# Patient Record
Sex: Male | Born: 1985 | Race: Black or African American | Hispanic: No | Marital: Single | Smoking: Former smoker
Health system: Southern US, Community
[De-identification: ages and names within clinical notes are randomized; demographics above are authoritative.]

---
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
## 2010-03-13 ENCOUNTER — Emergency Department (HOSPITAL_COMMUNITY): Admission: EM | Admit: 2010-03-13 | Discharge: 2010-03-13 | Payer: Self-pay | Admitting: Emergency Medicine

## 2014-08-18 ENCOUNTER — Emergency Department (HOSPITAL_COMMUNITY)
Admission: EM | Admit: 2014-08-18 | Discharge: 2014-08-18 | Disposition: A | Discharge status: Home / Self Care | Payer: Self-pay | Attending: Emergency Medicine | Admitting: Emergency Medicine

## 2014-08-18 ENCOUNTER — Encounter (HOSPITAL_COMMUNITY): Payer: Self-pay | Admitting: Emergency Medicine

## 2014-08-18 ENCOUNTER — Emergency Department (HOSPITAL_COMMUNITY): Payer: Self-pay

## 2014-08-18 DIAGNOSIS — R112 Nausea with vomiting, unspecified: Secondary | ICD-10-CM

## 2014-08-18 DIAGNOSIS — Z87891 Personal history of nicotine dependence: Secondary | ICD-10-CM | POA: Insufficient documentation

## 2014-08-18 DIAGNOSIS — K529 Noninfective gastroenteritis and colitis, unspecified: Secondary | ICD-10-CM

## 2014-08-18 DIAGNOSIS — R109 Unspecified abdominal pain: Secondary | ICD-10-CM

## 2014-08-18 DIAGNOSIS — R197 Diarrhea, unspecified: Secondary | ICD-10-CM

## 2014-08-18 LAB — CBC WITH DIFFERENTIAL/PLATELET
BASOS PCT: 0 % (ref 0–1)
Basophils Absolute: 0 10*3/uL (ref 0.0–0.1)
EOS PCT: 0 % (ref 0–5)
Eosinophils Absolute: 0.1 10*3/uL (ref 0.0–0.7)
HCT: 46.4 % (ref 39.0–52.0)
Hemoglobin: 15.3 g/dL (ref 13.0–17.0)
LYMPHS ABS: 0.7 10*3/uL (ref 0.7–4.0)
LYMPHS PCT: 5 % — AB (ref 12–46)
MCH: 26.9 pg (ref 26.0–34.0)
MCHC: 33 g/dL (ref 30.0–36.0)
MCV: 81.7 fL (ref 78.0–100.0)
Monocytes Absolute: 0.7 10*3/uL (ref 0.1–1.0)
Monocytes Relative: 5 % (ref 3–12)
Neutro Abs: 12.9 10*3/uL — ABNORMAL HIGH (ref 1.7–7.7)
Neutrophils Relative %: 90 % — ABNORMAL HIGH (ref 43–77)
Platelets: 293 10*3/uL (ref 150–400)
RBC: 5.68 MIL/uL (ref 4.22–5.81)
RDW: 13.8 % (ref 11.5–15.5)
WBC: 14.3 10*3/uL — AB (ref 4.0–10.5)

## 2014-08-18 LAB — BASIC METABOLIC PANEL
Anion gap: 8 (ref 5–15)
BUN: 12 mg/dL (ref 6–23)
CALCIUM: 9.3 mg/dL (ref 8.4–10.5)
CHLORIDE: 108 meq/L (ref 96–112)
CO2: 22 mmol/L (ref 19–32)
Creatinine, Ser: 0.74 mg/dL (ref 0.50–1.35)
GFR calc Af Amer: >90 mL/min (ref >90)
GFR calc non Af Amer: >90 mL/min (ref >90)
Glucose, Bld: 103 mg/dL — ABNORMAL HIGH (ref 70–99)
Potassium: 3.8 mmol/L (ref 3.5–5.1)
SODIUM: 138 mmol/L (ref 135–145)

## 2014-08-18 MED ORDER — ONDANSETRON HCL 4 MG/2ML IJ SOLN
4.0000 mg | Freq: Once | INTRAMUSCULAR | Status: AC
  Administered 2014-08-18: 4 mg via INTRAVENOUS
  Filled 2014-08-18: qty 2

## 2014-08-18 MED ORDER — CIPROFLOXACIN HCL 500 MG PO TABS: 500.0000 mg | ORAL_TABLET | Freq: Two times a day (BID) | ORAL | Status: AC

## 2014-08-18 MED ORDER — FENTANYL CITRATE 0.05 MG/ML IJ SOLN
25.0000 ug | Freq: Once | INTRAMUSCULAR | Status: AC
Start: 2014-08-18 — End: 2014-08-18
  Administered 2014-08-18: 25 ug via INTRAVENOUS
  Filled 2014-08-18: qty 2

## 2014-08-18 MED ORDER — METRONIDAZOLE 500 MG PO TABS: 500.0000 mg | ORAL_TABLET | Freq: Two times a day (BID) | ORAL | Status: AC

## 2014-08-18 MED ORDER — IOHEXOL 300 MG/ML  SOLN
100.0000 mL | Freq: Once | INTRAMUSCULAR | Status: AC | PRN
  Administered 2014-08-18: 100 mL via INTRAVENOUS

## 2014-08-18 MED ORDER — IOHEXOL 300 MG/ML  SOLN
50.0000 mL | Freq: Once | INTRAMUSCULAR | Status: AC | PRN
  Administered 2014-08-18: 50 mL via ORAL

## 2014-08-18 MED ORDER — ONDANSETRON HCL 4 MG PO TABS: 4.0000 mg | ORAL_TABLET | Freq: Four times a day (QID) | ORAL | Status: AC

## 2014-08-18 MED ORDER — FENTANYL CITRATE 0.05 MG/ML IJ SOLN
50.0000 ug | Freq: Once | INTRAMUSCULAR | Status: AC
  Administered 2014-08-18: 50 ug via INTRAVENOUS
  Filled 2014-08-18: qty 2

## 2014-08-18 MED ORDER — SODIUM CHLORIDE 0.9 % IV SOLN: 1000.0000 mL | INTRAVENOUS | Status: DC

## 2014-08-18 MED ORDER — METOCLOPRAMIDE HCL 5 MG/ML IJ SOLN
10.0000 mg | Freq: Once | INTRAMUSCULAR | Status: AC
  Administered 2014-08-18: 10 mg via INTRAVENOUS
  Filled 2014-08-18: qty 2

## 2014-08-18 MED ORDER — SODIUM CHLORIDE 0.9 % IV SOLN
1000.0000 mL | Freq: Once | INTRAVENOUS | Status: AC
  Administered 2014-08-18: 1000 mL via INTRAVENOUS

## 2014-08-18 MED ORDER — DIPHENHYDRAMINE HCL 50 MG/ML IJ SOLN
25.0000 mg | Freq: Once | INTRAMUSCULAR | Status: AC
  Administered 2014-08-18: 25 mg via INTRAVENOUS
  Filled 2014-08-18: qty 1

## 2014-08-18 MED ORDER — SODIUM CHLORIDE 0.9 % IV BOLUS (SEPSIS)
1000.0000 mL | Freq: Once | INTRAVENOUS | Status: AC
  Administered 2014-08-18: 1000 mL via INTRAVENOUS

## 2014-08-18 NOTE — Discharge Instructions (Signed)

## 2014-08-18 NOTE — ED Provider Notes (Signed)
CSN: [id number]     Arrival date & time 08/18/14  4098 History   First MD Initiated Contact with Patient 08/18/14 [phone]     Chief Complaint  Patient presents with  . Emesis     (Consider location/radiation/quality/duration/timing/severity/associated sxs/prior Treatment) HPI  Patient states he hasn't eaten any fast foods for many years. He states tonight he ate at [employer]. He states about 11 PM he had vomiting and diarrhea for about 45 minutes. He went back to bed about midnight however he was awakened at 2:20 AM with vomiting and diarrhea again. He states he feels dizzy and lightheaded. He complains of pain in his upper abdomen underneath his rib cage. He states his diarrhea is loose and watery without blood. His vomitus is also without blood. He denies any fever. He denies being around anybody else who is ill. He presents via EMS tonight.  PCP none  History reviewed. No pertinent past medical history. History reviewed. No pertinent past surgical history. History reviewed. No pertinent family history. History  Substance Use Topics  . Smoking status: Former Games developer  . Smokeless tobacco: Not on file  . Alcohol Use: Yes     Comment: occasional  employed +[employer]  Review of Systems  All other systems reviewed and are negative.     Allergies  Zithromax  Home Medications   Prior to Admission medications   Not on File   BP 130/85 mmHg  Pulse 84  Temp(Src) 98.7 F (37.1 C) (Oral)  Resp 18  Ht  (1.727 m)  Wt 165 lb (74.844 kg)  BMI 25.09 kg/m2  SpO2 100%  Vital signs normal   Physical Exam  Constitutional: He is oriented to person, place, and time. He appears well-developed and well-nourished.  Non-toxic appearance. He does not appear ill. No distress.  HENT:  Head: Normocephalic and atraumatic.  Right Ear: External ear normal.  Left Ear: External ear normal.  Nose: Nose normal. No mucosal edema or rhinorrhea.  Mouth/Throat: Mucous membranes are normal. No dental  abscesses or uvula swelling.  Tongue dry  Eyes: Conjunctivae and EOM are normal. Pupils are equal, round, and reactive to light.  Neck: Normal range of motion and full passive range of motion without pain. Neck supple.  Cardiovascular: Normal rate, regular rhythm and normal heart sounds.  Exam reveals no gallop and no friction rub.   No murmur heard. Pulmonary/Chest: Effort normal and breath sounds normal. No respiratory distress. He has no wheezes. He has no rhonchi. He has no rales. He exhibits no tenderness and no crepitus.  Abdominal: Soft. Normal appearance and bowel sounds are normal. He exhibits no distension. There is tenderness. There is no rebound and no guarding.  Mild diffuse tenderness  Musculoskeletal: Normal range of motion. He exhibits no edema or tenderness.  Moves all extremities well.   Neurological: He is alert and oriented to person, place, and time. He has normal strength. No cranial nerve deficit.  Skin: Skin is warm, dry and intact. No rash noted. No erythema. No pallor.  Psychiatric: He has a normal mood and affect. His speech is normal and behavior is normal. His mood appears not anxious.  Nursing note and vitals reviewed.   ED Course  Procedures (including critical care time)  Medications  0.9 %  sodium chloride infusion (0 mLs Intravenous Stopped 08/18/14 0420)    Followed by  0.9 %  sodium chloride infusion (0 mLs Intravenous Stopped 08/18/14 0512)    Followed by  0.9 %  sodium  chloride infusion (not administered)  ondansetron (ZOFRAN) injection 4 mg (not administered)  fentaNYL (SUBLIMAZE) injection 50 mcg (not administered)  metoCLOPramide (REGLAN) injection 10 mg (10 mg Intravenous Given 08/18/14 0341)  diphenhydrAMINE (BENADRYL) injection 25 mg (25 mg Intravenous Given 08/18/14 0341)  sodium chloride 0.9 % bolus 1,000 mL (0 mLs Intravenous Stopped 08/18/14 0610)  ondansetron (ZOFRAN) injection 4 mg (4 mg Intravenous Given 08/18/14 0533)  fentaNYL (SUBLIMAZE)  injection 25 mcg (25 mcg Intravenous Given 08/18/14 0533)    Recheck at 04 45 patient resting. He has had 2 L of normal saline. He has not had urinary output yet. He states he's feeling a little bit better.  Recheck 520 nurse reports patient complaining of abdominal pain. Since sleeping. When awakened he states he is still having some abdominal pain. Although he states his pain is in the upper abdomen he is only painful very mildly to palpation in his lower abdomen bilaterally. He states his pain has improved from when he arrived to the ED.  Recheck 06:40 pt still has nausea. States he is still having pain and it is getting more painful in his RLQ. On exam he has pain mainly in the RLQ. Will order AP CT.   PT left at change of shift with Dr Rosalia Hammersay to get AP CT results.  Labs Review Results for orders placed or performed during the hospital encounter of 08/18/14  Basic metabolic panel  Result Value Ref Range   Sodium 138 135 - 145 mmol/L   Potassium 3.8 3.5 - 5.1 mmol/L   Chloride 108 96 - 112 mEq/L   CO2 22 19 - 32 mmol/L   Glucose, Bld 103 (H) 70 - 99 mg/dL   BUN 12 6 - 23 mg/dL   Creatinine, Ser 4.090.74 0.50 - 1.35 mg/dL   Calcium 9.3 8.4 - 81.110.5 mg/dL   GFR calc non Af Amer >90 >90 mL/min   GFR calc Af Amer >90 >90 mL/min   Anion gap 8 5 - 15  CBC with Differential  Result Value Ref Range   WBC 14.3 (H) 4.0 - 10.5 K/uL   RBC 5.68 4.22 - 5.81 MIL/uL   Hemoglobin 15.3 13.0 - 17.0 g/dL   HCT 91.446.4 78.239.0 - 95.652.0 %   MCV 81.7 78.0 - 100.0 fL   MCH 26.9 26.0 - 34.0 pg   MCHC 33.0 30.0 - 36.0 g/dL   RDW 21.313.8 08.611.5 - 57.815.5 %   Platelets 293 150 - 400 K/uL   Neutrophils Relative % 90 (H) 43 - 77 %   Neutro Abs 12.9 (H) 1.7 - 7.7 K/uL   Lymphocytes Relative 5 (L) 12 - 46 %   Lymphs Abs 0.7 0.7 - 4.0 K/uL   Monocytes Relative 5 3 - 12 %   Monocytes Absolute 0.7 0.1 - 1.0 K/uL   Eosinophils Relative 0 0 - 5 %   Eosinophils Absolute 0.1 0.0 - 0.7 K/uL   Basophils Relative 0 0 - 1 %   Basophils  Absolute 0.0 0.0 - 0.1 K/uL   Laboratory interpretation all normal except for leukocytosis  Imaging Review AP CT pending   EKG Interpretation None      MDM   Final diagnoses:  Nausea vomiting and diarrhea  Abdominal pain, unspecified abdominal location    Disposition pending   Devoria AlbeIva Ijanae Macapagal, MD, Armando GangFACEP     Ward GivensIva L Delores Edelstein, MD 08/18/14 [phone]

## 2014-08-18 NOTE — ED Provider Notes (Signed)
29 y.o. Male c.o. Vomiting and diarrhea after eating fast food now with rlq pain.  CT of abdomen pending.  May d/c of negative.   Results for orders placed or performed during the hospital encounter of 08/18/14  Basic metabolic panel  Result Value Ref Range   Sodium 138 135 - 145 mmol/L   Potassium 3.8 3.5 - 5.1 mmol/L   Chloride 108 96 - 112 mEq/L   CO2 22 19 - 32 mmol/L   Glucose, Bld 103 (H) 70 - 99 mg/dL   BUN 12 6 - 23 mg/dL   Creatinine, Ser 1.610.74 0.50 - 1.35 mg/dL   Calcium 9.3 8.4 - 09.610.5 mg/dL   GFR calc non Af Amer >90 >90 mL/min   GFR calc Af Amer >90 >90 mL/min   Anion gap 8 5 - 15  CBC with Differential  Result Value Ref Range   WBC 14.3 (H) 4.0 - 10.5 K/uL   RBC 5.68 4.22 - 5.81 MIL/uL   Hemoglobin 15.3 13.0 - 17.0 g/dL   HCT 04.546.4 40.939.0 - 81.152.0 %   MCV 81.7 78.0 - 100.0 fL   MCH 26.9 26.0 - 34.0 pg   MCHC 33.0 30.0 - 36.0 g/dL   RDW 91.413.8 78.211.5 - 95.615.5 %   Platelets 293 150 - 400 K/uL   Neutrophils Relative % 90 (H) 43 - 77 %   Neutro Abs 12.9 (H) 1.7 - 7.7 K/uL   Lymphocytes Relative 5 (L) 12 - 46 %   Lymphs Abs 0.7 0.7 - 4.0 K/uL   Monocytes Relative 5 3 - 12 %   Monocytes Absolute 0.7 0.1 - 1.0 K/uL   Eosinophils Relative 0 0 - 5 %   Eosinophils Absolute 0.1 0.0 - 0.7 K/uL   Basophils Relative 0 0 - 1 %   Basophils Absolute 0.0 0.0 - 0.1 K/uL   Ct Abdomen Pelvis W Contrast  08/18/2014   CLINICAL DATA:  Lower abdominal pain with nausea and vomiting for 1 day  EXAM: CT ABDOMEN AND PELVIS WITH CONTRAST  TECHNIQUE: Multidetector CT imaging of the abdomen and pelvis was performed using the standard protocol following bolus administration of intravenous contrast. Oral contrast was also administered.  CONTRAST:  100mL OMNIPAQUE IOHEXOL 300 MG/ML SOLN, 50mL OMNIPAQUE IOHEXOL 300 MG/ML SOLN  COMPARISON:  None.  FINDINGS: Lung bases are clear.  There is a small hiatal hernia.  No focal liver lesions are identified. Gallbladder wall is not appreciably thickened. There is no  biliary duct dilatation.  Spleen, pancreas, and adrenals appear normal. Kidneys bilaterally show no mass or hydronephrosis on either side. There is no renal or ureteral calculus on either side.  In the pelvis, the urinary bladder is midline with wall thickness within normal limits. There is no pelvic mass or fluid collection. Appendix appears normal. Terminal ileum appears normal.  There is mild wall thickening with edema involving the right and much of the transverse colon consistent with a degree of nonspecific colitis. There is no abscess or surrounding mesenteric thickening. There is no demonstrable bowel obstruction. No free air or portal venous air. There is some equivocal wall thickening in several loops of ileum in the right abdomen.  There is no ascites, adenopathy, or abscess in the abdomen or pelvis. There is no demonstrable abdominal aortic aneurysm. There are no blastic or lytic bone lesions.  IMPRESSION: Findings felt to represent a degree of nonspecific enteritis and colitis. No abscess. No obstruction.  Appendix appears normal.  No renal or ureteral  calculi.  Small hiatal hernia.   Electronically Signed   By: Bretta Bang M.D.   On: 08/18/2014 08:31     Patient with labs and ct c.w. Enteritis/colitis.  Patient taking po well here and appears stable for discharge.  Given ct findings and labs plan abx cipro and flagyl.  Patient given return precautions and voices understanding including need for follow up.   Hilario Quarry, MD 08/19/14 [phone]

## 2014-08-18 NOTE — ED Notes (Signed)
Patient with no complaints at this time. Respirations even and unlabored. Skin warm/dry. Discharge instructions reviewed with patient at this time. Patient given opportunity to voice concerns/ask questions. IV removed per policy and band-aid applied to site. Patient discharged at this time and left Emergency Department with steady gait.  

## 2014-08-18 NOTE — ED Notes (Signed)
Patient complaining of nausea, vomiting, and diarrhea that started tonight.

## 2015-08-21 IMAGING — CT CT ABD-PELV W/ CM
2 of 4 series · 16 of 46 positions shown, 18 images · IV contrast (Omnipaque 300)
Comparison: None.

CLINICAL DATA: Lower abdominal pain with nausea and vomiting for 1
day

EXAM:
CT ABDOMEN AND PELVIS WITH CONTRAST
TECHNIQUE: Multidetector CT imaging of the abdomen and pelvis was performed
using the standard protocol following bolus administration of
intravenous contrast. Oral contrast was also administered.
CONTRAST:  100mL OMNIPAQUE IOHEXOL 300 MG/ML SOLN, 50mL OMNIPAQUE
IOHEXOL 300 MG/ML SOLN

[Series 2: abd_pel_with 5.0 b40f · axial · 0.68mm/px · z∈[-487,-97]mm · 13 of 86 slices shown, 15 images]
[im 4/86  soft-tissue]
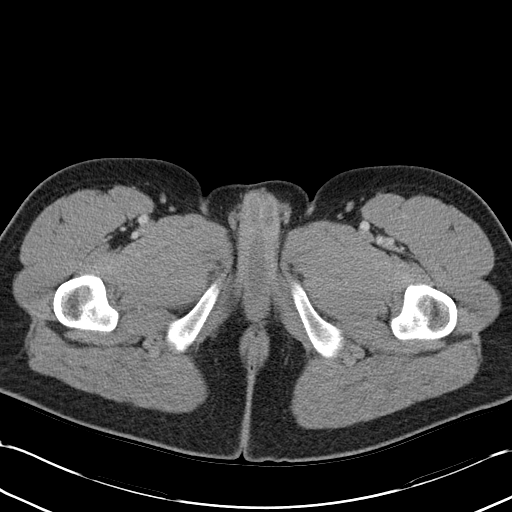
[im 4/86  bone]
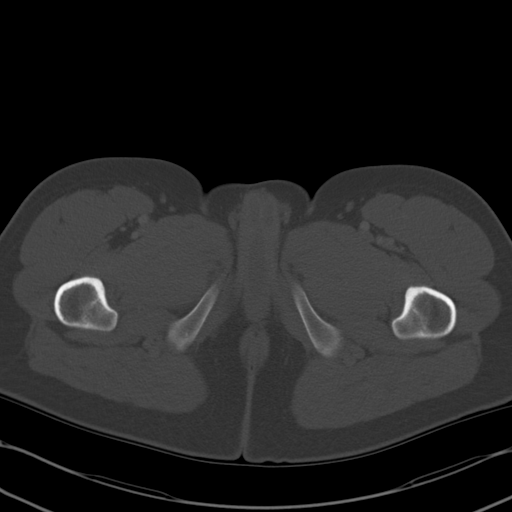
[im 11/86  soft-tissue]
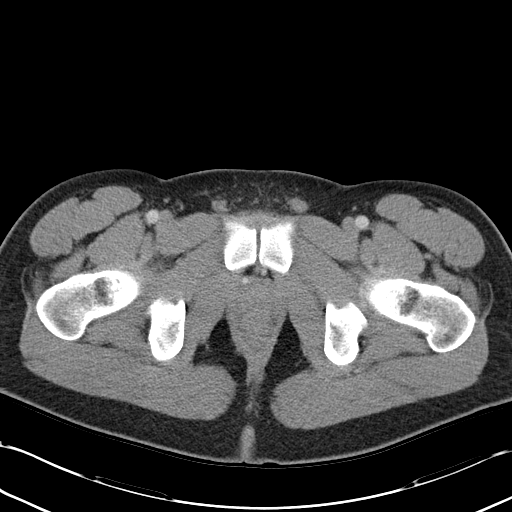
[im 18/86  soft-tissue]
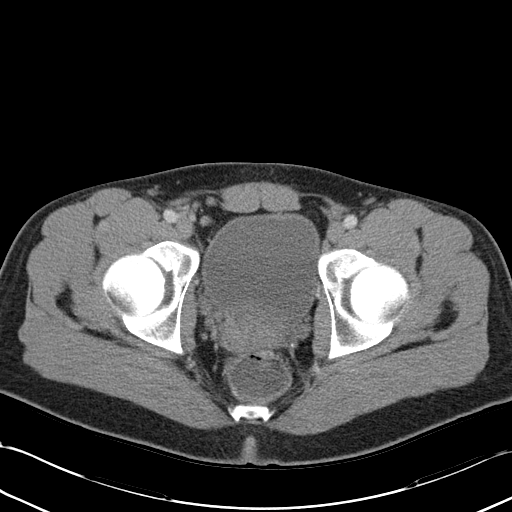
[im 24/86  soft-tissue]
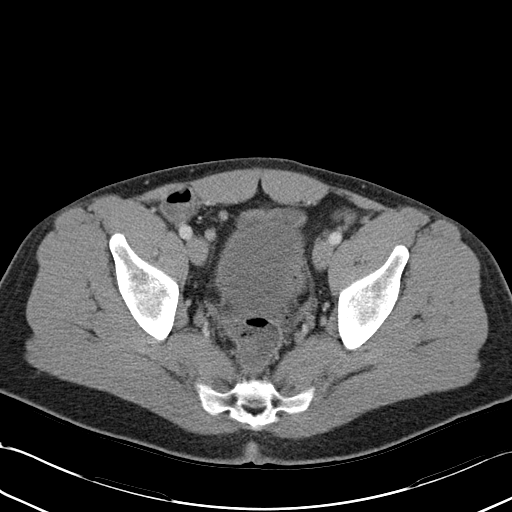
[im 31/86  soft-tissue]
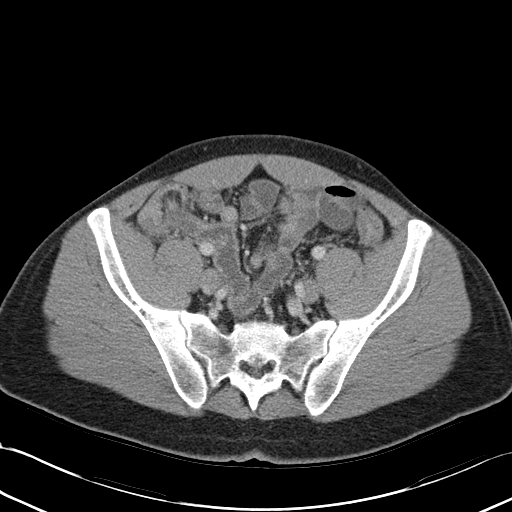
[im 38/86  soft-tissue]
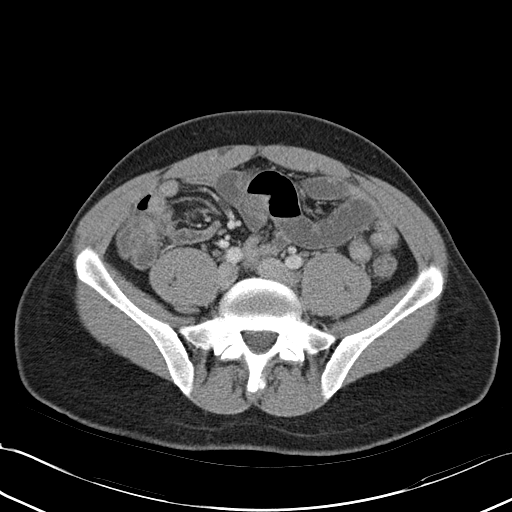
[im 45/86  soft-tissue]
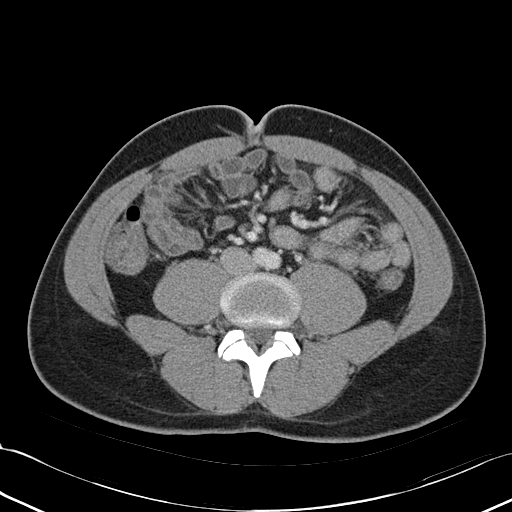
[im 48/86  soft-tissue]
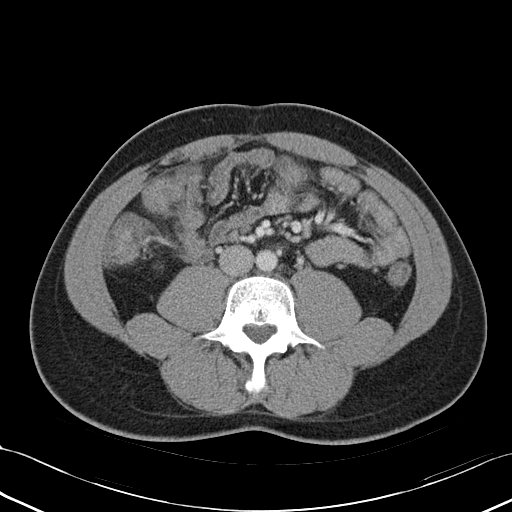
[im 55/86  soft-tissue]
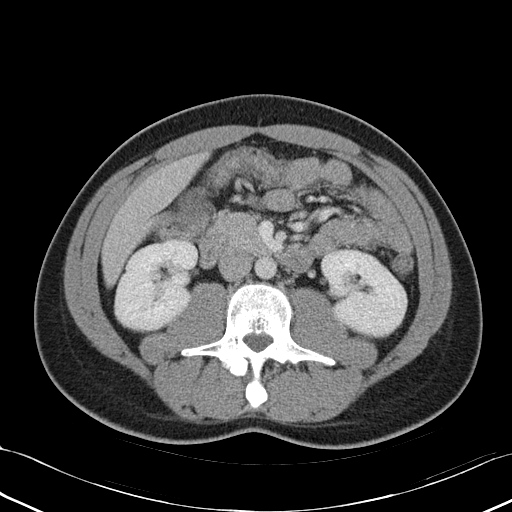
[im 55/86  bone]
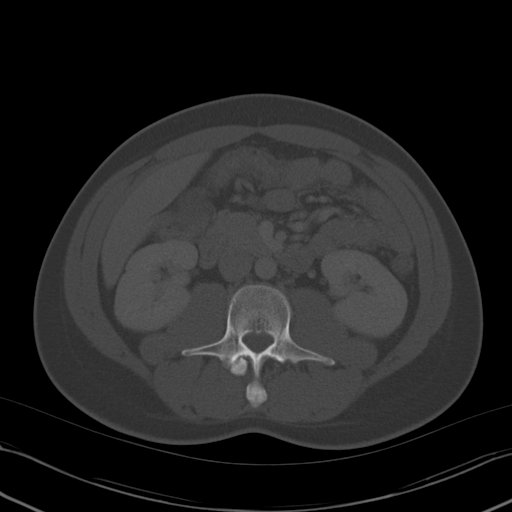
[im 62/86  soft-tissue]
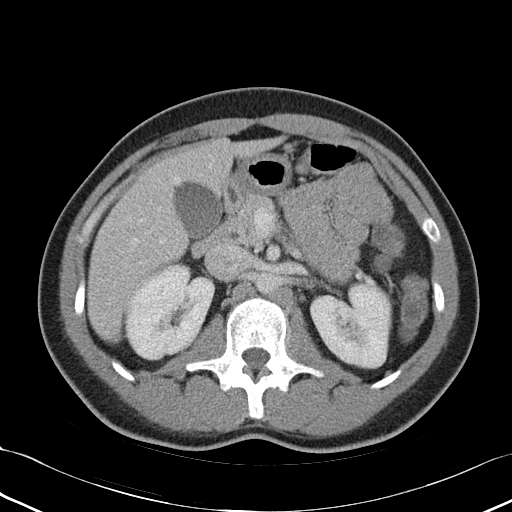
[im 69/86  soft-tissue]
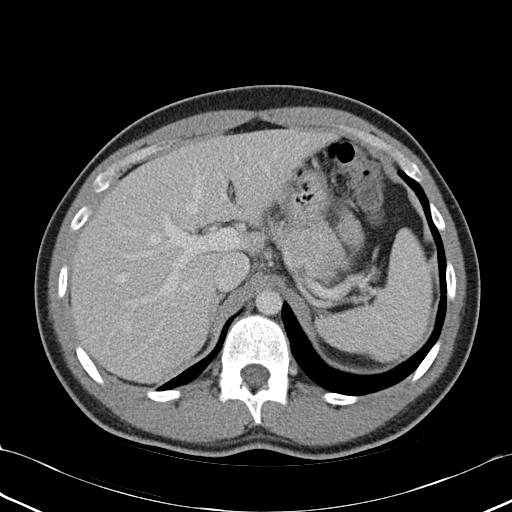
[im 75/86  soft-tissue]
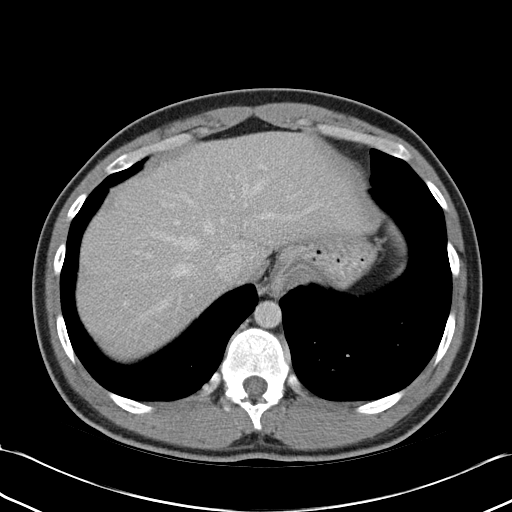
[im 82/86  soft-tissue]
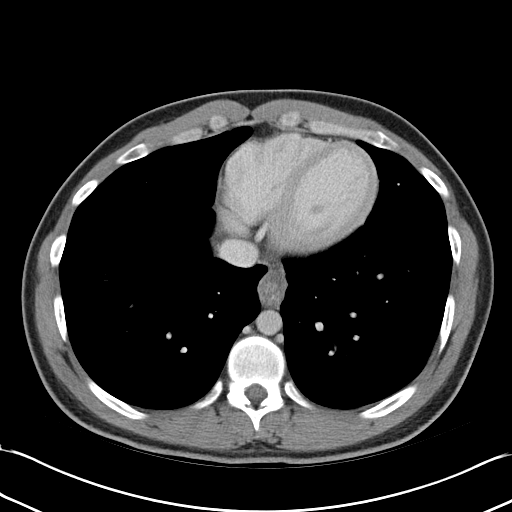

[Series 3: abd_pel_with 3.0 spo cor · coronal · 0.61mm/px · 3 of 87 slices shown]
[im 29/87  soft-tissue]
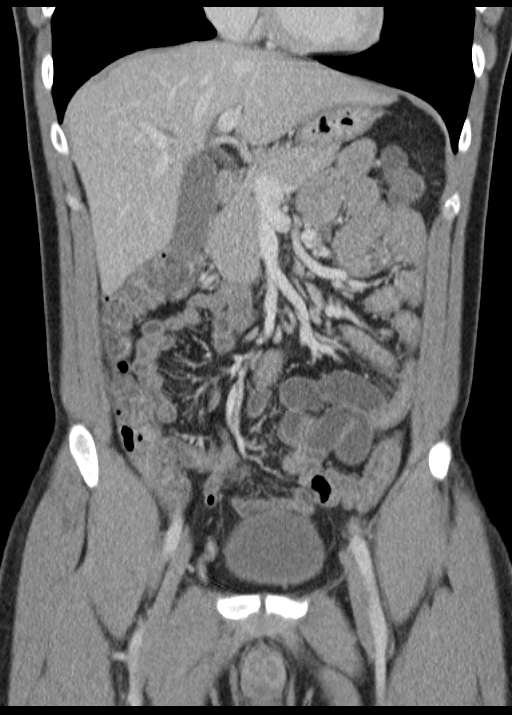
[im 39/87  soft-tissue]
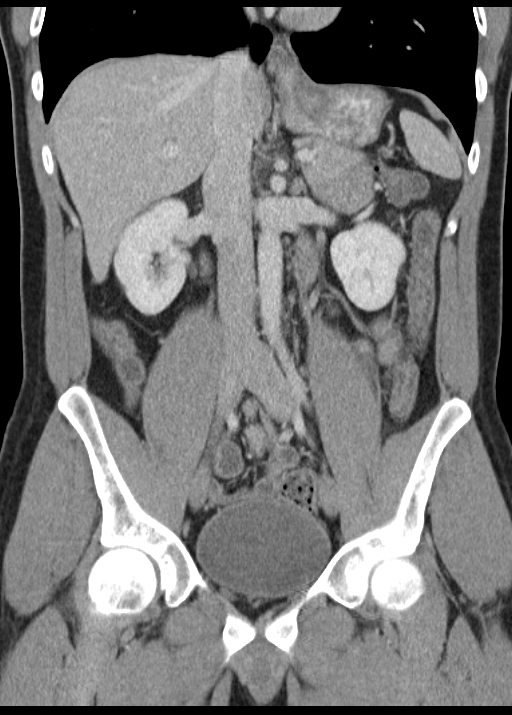
[im 48/87  soft-tissue]
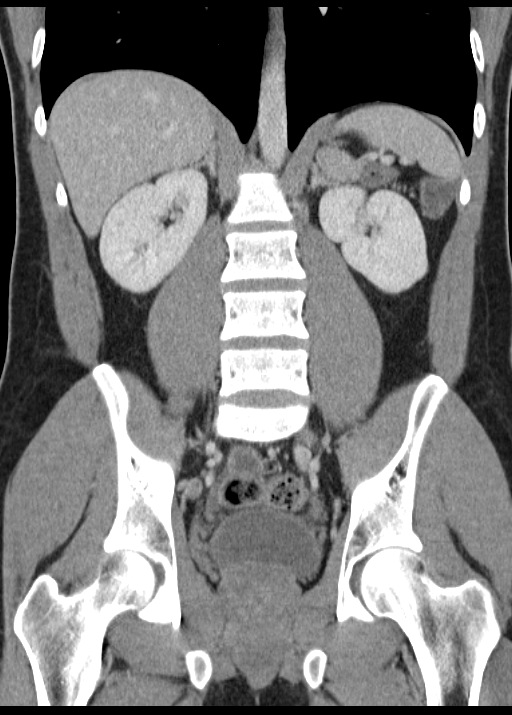

[16 of 46 positions shown; findings below may reference images not displayed]

FINDINGS: Lung bases are clear.  There is a small hiatal hernia.

No focal liver lesions are identified. Gallbladder wall is not
appreciably thickened. There is no biliary duct dilatation.

Spleen, pancreas, and adrenals appear normal. Kidneys bilaterally
show no mass or hydronephrosis on either side. There is no renal or
ureteral calculus on either side.

In the pelvis, the urinary bladder is midline with wall thickness
within normal limits. There is no pelvic mass or fluid collection.
Appendix appears normal. Terminal ileum appears normal.

There is mild wall thickening with edema involving the right and
much of the transverse colon consistent with a degree of nonspecific
colitis. There is no abscess or surrounding mesenteric thickening.
There is no demonstrable bowel obstruction. No free air or portal
venous air. There is some equivocal wall thickening in several loops
of ileum in the right abdomen.

There is no ascites, adenopathy, or abscess in the abdomen or
pelvis. There is no demonstrable abdominal aortic aneurysm. There
are no blastic or lytic bone lesions.
IMPRESSION: Findings felt to represent a degree of nonspecific enteritis and
colitis. No abscess. No obstruction.

Appendix appears normal.

No renal or ureteral calculi.

Small hiatal hernia.
